# Patient Record
Sex: Female | Born: 2000 | Race: White | Hispanic: No | Marital: Single | Smoking: Never smoker
Health system: Southern US, Community
[De-identification: ages and names within clinical notes are randomized; demographics above are authoritative.]

## PROBLEM LIST (undated history)

## (undated) DIAGNOSIS — J45909 Unspecified asthma, uncomplicated: Secondary | ICD-10-CM

## (undated) HISTORY — PX: BACK SURGERY: SHX140

## (undated) HISTORY — PX: ADENOIDECTOMY: SHX5191

## (undated) HISTORY — PX: TYMPANOSTOMY TUBE PLACEMENT: SHX32

---
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
## 2020-01-01 NOTE — Progress Notes (Signed)
Virtual Visit via Video Note The purpose of this virtual visit is to provide medical care while limiting exposure to the novel coronavirus.    Consent was obtained for video visit:  Yes.   Answered questions that patient had about telehealth interaction:  Yes.   I discussed the limitations, risks, security and privacy concerns of performing an evaluation and management service by telemedicine. I also discussed with the patient that there may be a patient responsible charge related to this service. The patient expressed understanding and agreed to proceed.  Pt location: Home Physician Location: office Name of referring provider:  Cox, Daryel November, MD I connected with Barbee Shropshire Pickerill at patients initiation/request on 01/02/2020 at  3:30 PM EDT by video enabled telemedicine application and verified that I am speaking with the correct person using two identifiers. Pt MRN:  [id number] Pt DOB:  12-24-2000 Video Participants:  Barbee Shropshire Cara   History of Present Illness:  Kim Turner. Mees is an 19 year old female who follows up for migraine.  UPDATE: MRI of brain was ordered but not performed.  She never received a call to schedule.  Intensity:  severe Duration:  Within an hour with rizatriptan Frequency:  2 to 3 in past 30 days Frequency of abortive medication: 2 to 3 days in past month Rescue protocol:  Rizatriptan 10mg  Current NSAIDS:  naproxen 550mg  (has not been using) Current analgesics:  Tylenol Current triptans:  rizatriptan 10mg  Current ergotamine:  none Current anti-emetic:  none Current muscle relaxants:  none Current anti-anxiolytic:  none Current sleep aide:  none Current Antihypertensive medications:  none Current Antidepressant medications:  none Current Anticonvulsant medications:  none Current anti-CGRP:  none Current Vitamins/Herbal/Supplements:  none Current Antihistamines/Decongestants:  none Other therapy:  none Hormone/birth control:  Turner Other medications:   none  Caffeine:  2 coffees a month; 1 to 2 cans of Pepsi daily Diet:  Does not skip meals.  Vegetarian.  1 to 2 cans of soda daily.  16 oz water daily Exercise:  Not routine Depression:  no; Anxiety:  A little Other pain: no Sleep hygiene: Good.  Feels tired.  HISTORY: Onset:  20 years old. Location:  Varies; middle of head, top of head Quality:  pounding Initial intensity:  5/10.  She denies new headache, thunderclap headache or severe headache that wakes her from sleep. Aura:  no Premonitory Phase:  no Postdrome:  no Associated symptoms:  Photophobia, phonophobia; sometimes nausea.  She denies associated visual disturbance or unilateral numbness or weakness. Initial duration:  Usually wakes up with them.  They last all day Initial drequency:  3 days a week requency of abortive medication: Advil or Tylenol 3 times a month Triggers:  Sometimes loud sounds Relieving factors:  Laying down in dark Activity:  aggravates  Imaging (personally reviewed): 09/01/2016 Carotid US:  Marked heterogeneity of imaged portions of thyroid gland, nonspecific;  Otherwise, normal carotid doppler ultrasound. 09/02/2016 MRI Brain Wo:  No acute intracranial abnormality.  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 09/02/2016 MRA Head:  Normal variant diminutive right A1 and duplicated right MCA M1 but no pathologic intracranial vascular abnormality.    Past NSAIDS:  none Past analgesics:  none Past abortive triptans:  Sumatriptan 50mg  (ineffective) Past abortive ergotamine:  none Past muscle relaxants:  none Past anti-emetic:  none Past antihypertensive medications:  Atenolol 25mg  Past antidepressant medications:  none Past anticonvulsant medications:  none Past anti-CGRP:  none Past vitamins/Herbal/Supplements:  none Past antihistamines/decongestants:  none Other past therapies:  none   Family history of headache:  no  Past Medical History: Past Medical History:  Diagnosis Date  . Asthma      Medications: Outpatient Encounter Medications as of 01/02/2020  Medication Sig  . naproxen sodium (ANAPROX DS) 550 MG tablet Take 1 tablet (550 mg total) by mouth 2 (two) times daily as needed.  Kim Turner 3-0.02 MG tablet   . rizatriptan (MAXALT) 10 MG tablet Take 1 tablet earliest onset of migraine.  May repeat in 2 hours if needed.  Maximum 2 tablets in 24 hours.   No facility-administered encounter medications on file as of 01/02/2020.    Allergies: No Known Allergies  Family History: Family History  Problem Relation Age of Onset  . Healthy Mother   . Healthy Father   . Cancer Maternal Grandfather   . Thyroid disease Neg Hx     Social History: Social History   Socioeconomic History  . Marital status: Single    Spouse name: Not on file  . Number of children: 0  . Years of education: Not on file  . Highest education level: High school graduate  Occupational History  . Not on file  Tobacco Use  . Smoking status: Never Smoker  . Smokeless tobacco: Never Used  Substance and Sexual Activity  . Alcohol use: Never  . Drug use: Never  . Sexual activity: Not on file  Other Topics Concern  . Not on file  Social History Narrative   Kim Turner is a 12th grade student.   She attends [employer]	[place].   She lives with both parents. She has two siblings.   She enjoys art, cooking, and video games,   Right handed   Drinks coffee 1 cup a week, tea sometimes, soda 1 can a day   Social Determinants of Corporate investment banker Strain:   . Difficulty of Paying Living Expenses:   Food Insecurity:   . Worried About Programme researcher, broadcasting/film/video in the Last Year:   . Barista in the Last Year:   Transportation Needs:   . Freight forwarder (Medical):   Kim Kitchen Lack of Transportation (Non-Medical):   Physical Activity:   . Days of Exercise per Week:   . Minutes of Exercise per Session:   Stress:   . Feeling of Stress :   Social Connections:   . Frequency of Communication  with Friends and Family:   . Frequency of Social Gatherings with Friends and Family:   . Attends Religious Services:   . Active Member of Clubs or Organizations:   . Attends Banker Meetings:   Kim Kitchen Marital Status:   Intimate Partner Violence:   . Fear of Current or Ex-Partner:   . Emotionally Abused:   Kim Kitchen Physically Abused:   . Sexually Abused:     Observations/Objective:   Height 5\' 5"  (1.651 m), weight 245 lb (111.1 kg). No acute distress.  Alert and oriented.  Speech fluent and not dysarthric.  Language intact.  Eyes orthophoric on primary gaze.  Face symmetric.  Assessment and Plan:   1.  Migraine without aura, without status migrainosus, not intractable 2.  Pineal gland cyst  1. Will reorder MRI of brain with and without contrast to follow up on pineal gland cyst.  If she doesn't hear from anyone by next Friday to schedule it, she will contact Sunday.  2. For abortive therapy, rizatriptan 10mg  3.  Limit use of pain relievers to no more than 2  days out of week to prevent risk of rebound or medication-overuse headache. 4.  Keep headache diary 5.  Exercise, hydration, caffeine cessation, sleep hygiene, monitor for and avoid triggers 6. Follow up 6 months.   Follow Up Instructions:    -I discussed the assessment and treatment plan with the patient. The patient was provided an opportunity to ask questions and all were answered. The patient agreed with the plan and demonstrated an understanding of the instructions.   The patient was advised to call back or seek an in-person evaluation if the symptoms worsen or if the condition fails to improve as anticipated.   Cira Servant, DO

## 2020-01-02 ENCOUNTER — Telehealth (INDEPENDENT_AMBULATORY_CARE_PROVIDER_SITE_OTHER): Payer: Managed Care, Other (non HMO) | Admitting: Neurology

## 2020-01-02 ENCOUNTER — Other Ambulatory Visit: Payer: Self-pay

## 2020-01-02 ENCOUNTER — Encounter: Payer: Self-pay | Admitting: Neurology

## 2020-01-02 VITALS — Ht 65.0 in | Wt 245.0 lb

## 2020-01-02 DIAGNOSIS — G43009 Migraine without aura, not intractable, without status migrainosus: Secondary | ICD-10-CM | POA: Diagnosis not present

## 2020-01-02 DIAGNOSIS — E348 Other specified endocrine disorders: Secondary | ICD-10-CM | POA: Diagnosis not present

## 2020-02-06 ENCOUNTER — Ambulatory Visit
Admission: RE | Admit: 2020-02-06 | Discharge: 2020-02-06 | Disposition: A | Discharge status: Home / Self Care | Payer: Managed Care, Other (non HMO) | Source: Ambulatory Visit | Attending: Neurology | Admitting: Neurology

## 2020-02-06 DIAGNOSIS — E348 Other specified endocrine disorders: Secondary | ICD-10-CM

## 2020-02-06 MED ORDER — GADOBENATE DIMEGLUMINE 529 MG/ML IV SOLN
20.0000 mL | Freq: Once | INTRAVENOUS | Status: AC | PRN
  Administered 2020-02-06: 20 mL via INTRAVENOUS

## 2020-03-18 ENCOUNTER — Other Ambulatory Visit: Payer: Self-pay | Admitting: Neurology

## 2020-04-01 ENCOUNTER — Ambulatory Visit: Payer: Managed Care, Other (non HMO) | Attending: Internal Medicine

## 2020-04-01 DIAGNOSIS — Z23 Encounter for immunization: Secondary | ICD-10-CM

## 2020-04-01 NOTE — Progress Notes (Signed)
   Covid-19 Vaccination Clinic  Name:  MARCUS GROLL    MRN: [id number] DOB: 09/08/2000  04/01/2020  Ms. Mizzell was observed post Covid-19 immunization for 15 minutes without incident. She was provided with Vaccine Information Sheet and instruction to access the V-Safe system.   Ms. Haley was instructed to call 911 with any severe reactions post vaccine: [name] Difficulty breathing  . Swelling of face and throat  . A fast heartbeat  . A bad rash all over body  . Dizziness and weakness   Immunizations Administered    Name Date Dose VIS Date Route   Pfizer COVID-19 Vaccine 04/01/2020  5:08 PM 0.3 mL 10/29/2018 Intramuscular   Manufacturer: [employer], [employer]   Lot: [id number]   NDC: [address]-5

## 2020-07-02 NOTE — Progress Notes (Signed)
NEUROLOGY FOLLOW UP OFFICE NOTE  Kim Turner [id number]  HISTORY OF PRESENT ILLNESS: Kim Turner is an 19 year old female who follows up for migraine.  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  She is doing well. Intensity:  severe Duration:  Within an hour with rizatriptan Frequency:  1 to 2 in past 30 days Frequency of abortive medication: 2 to 3 days in past month Rescue protocol:  Rizatriptan 10mg  Current NSAIDS:naproxen 550mg  (has not been using) Current analgesics:Tylenol Current triptans:rizatriptan 10mg  Current ergotamine:none Current anti-emetic:none Current muscle relaxants:none Current anti-anxiolytic:none Current sleep aide:none Current Antihypertensive medications:none Current Antidepressant medications:none Current Anticonvulsant medications:none Current anti-CGRP:none Current Vitamins/Herbal/Supplements:none Current Antihistamines/Decongestants:none Other therapy:none Hormone/birth control:[name] Other medications:none  Caffeine:2 coffees a month; 1 to 2 cans of Pepsi daily Diet:Does not skip meals.Vegetarian.1 to 2 cans of soda daily. 16 oz water daily Exercise:Not routine Depression:no; Anxiety:A little Other pain:no Sleep hygiene:Good. Feels tired.  HISTORY: Onset:19 years old. Location:Varies; middle of head, top of head Quality:pounding Initial intensity:5/10.Shedenies new headache, thunderclap headache or severe headache that wakes herfrom sleep. Aura:no Premonitory Phase:no Postdrome:no Associated symptoms:Photophobia, phonophobia; sometimes nausea.Shedenies associated visual disturbance orunilateral numbness or weakness. Initial duration:Usually wakes up with them. They last all day Initial drequency:3 days a week requency of  abortive medication:Advil or Tylenol 3 times a month Triggers:Sometimes loud sounds Relieving factors:Laying down in dark Activity:aggravates  Imaging (personally reviewed): 09/01/2016 Carotid : Marked heterogeneity of imaged portions of thyroid gland, nonspecific; Otherwise, normal carotid doppler ultrasound. 09/02/2016 MRI Brain Wo: No acute intracranial abnormality. 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 09/02/2016 MRA Head: Normal variant diminutive right A1 and duplicated right MCA M1 but no pathologic intracranial vascular abnormality.   Past NSAIDS:none Past analgesics:none Past abortive triptans:Sumatriptan 50mg  (ineffective) Past abortive ergotamine:none Past muscle relaxants:none Past anti-emetic:none Past antihypertensive medications:Atenolol 25mg  Past antidepressant medications:none Past anticonvulsant medications:none Past anti-CGRP:none Past vitamins/Herbal/Supplements:none Past antihistamines/decongestants:none Other past therapies:none   Family history of headache:no  PAST MEDICAL HISTORY: Past Medical History:  Diagnosis Date  . Asthma     MEDICATIONS: Current Outpatient Medications on File Prior to Visit  Medication Sig Dispense Refill  . naproxen sodium (ANAPROX DS) 550 MG tablet Take 1 tablet (550 mg total) by mouth 2 (two) times daily as needed. 16 tablet 3  . [name] 3-0.02 MG tablet     . rizatriptan (MAXALT) 10 MG tablet TAKE 1 TABLET EARLIEST ONSET OF MIGRAINE. MAY REPEAT IN 2 HOURS IF NEEDED.MAXIMUM 2 TAB IN 24 HOURS 10 tablet 3   No current facility-administered medications on file prior to visit.    ALLERGIES: No Known Allergies  FAMILY HISTORY: Family History  Problem Relation Age of Onset  . Healthy Mother   . Healthy Father   . Cancer Maternal Grandfather   . Thyroid disease Neg Hx     SOCIAL HISTORY: Social History   Socioeconomic History  . Marital status: Single    Spouse name: Not on file  .  Number of children: 0  . Years of education: Not on file  . Highest education level: High school graduate  Occupational History  . Not on file  Tobacco Use  . Smoking status: Never Smoker  . Smokeless tobacco: Never Used  Vaping Use  . Vaping Use: Never used  Substance and Sexual Activity  . Alcohol use: Never  . Drug use: Never  . Sexual activity: Not on  file  Other Topics Concern  . Not on file  Social History Narrative   Kim Turner is a 12th grade student.   She attends [employer]	[place].   She lives with both parents. She has two siblings.   She enjoys art, cooking, and video games,   Right handed   Drinks coffee 1 cup a week, tea sometimes, soda 1 can a day   Social Determinants of Health   Financial Resource Strain:   . Difficulty of Paying Living Expenses: Not on file  Food Insecurity:   . Worried About Programme researcher, broadcasting/film/video in the Last Year: Not on file  . Ran Out of Food in the Last Year: Not on file  Transportation Needs:   . Lack of Transportation (Medical): Not on file  . Lack of Transportation (Non-Medical): Not on file  Physical Activity:   . Days of Exercise per Week: Not on file  . Minutes of Exercise per Session: Not on file  Stress:   . Feeling of Stress : Not on file  Social Connections:   . Frequency of Communication with Friends and Family: Not on file  . Frequency of Social Gatherings with Friends and Family: Not on file  . Attends Religious Services: Not on file  . Active Member of Clubs or Organizations: Not on file  . Attends Banker Meetings: Not on file  . Marital Status: Not on file  Intimate Partner Violence:   . Fear of Current or Ex-Partner: Not on file  . Emotionally Abused: Not on file  . Physically Abused: Not on file  . Sexually Abused: Not on file    PHYSICAL EXAM: Blood pressure 137/86, pulse 74, resp. rate 18, height 5\' 5"  (1.651 m), weight 271 lb (122.9 kg), SpO2 99 %. General: No acute distress.  Patient  appears well-groomed.   Head:  Normocephalic/atraumatic Eyes:  Fundi examined but not visualized Neck: supple, no paraspinal tenderness, full range of motion Heart:  Regular rate and rhythm Lungs:  Clear to auscultation bilaterally Back: No paraspinal tenderness Neurological Exam: alert and oriented to person, place, and time. Attention span and concentration intact, recent and remote memory intact, fund of knowledge intact.  Speech fluent and not dysarthric, language intact.  CN II-XII intact. Bulk and tone normal, muscle strength 5/5 throughout.  Sensation to light touch, temperature and vibration intact.  Deep tendon reflexes 2+ throughout, toes downgoing.  Finger to nose and heel to shin testing intact.  Gait normal, Romberg negative.  IMPRESSION: Migraine without aura, without status migrainosus, not intractable Pineal gland cyst, stable  PLAN: 1.  Rizatriptan 10mg  as needed.  Limit use of pain relievers to no more than 2 days out of week to prevent risk of rebound or medication-overuse headache. 2.  Keep headache diary 3.  We can repeat MRI in 5 years 4.  Follow up one year.  , DO  CC: , MD

## 2020-07-05 ENCOUNTER — Ambulatory Visit: Payer: Managed Care, Other (non HMO) | Admitting: Neurology

## 2020-07-05 ENCOUNTER — Encounter: Payer: Self-pay | Admitting: Neurology

## 2020-07-05 ENCOUNTER — Other Ambulatory Visit: Payer: Self-pay

## 2020-07-05 VITALS — BP 137/86 | HR 74 | Resp 18 | Ht 65.0 in | Wt 271.0 lb

## 2020-07-05 DIAGNOSIS — E348 Other specified endocrine disorders: Secondary | ICD-10-CM

## 2020-07-05 DIAGNOSIS — G43009 Migraine without aura, not intractable, without status migrainosus: Secondary | ICD-10-CM | POA: Diagnosis not present

## 2020-07-05 NOTE — Patient Instructions (Signed)
  1. We don't need to repeat MRI for 5 years. 2. Take rizatriptan 10mg  at earliest onset of headache.  May repeat dose once in 2 hours if needed.  Maximum 2 tablets in 24 hours. 3. Limit use of pain relievers to no more than 2 days out of the week.  These medications include acetaminophen, NSAIDs (ibuprofen/Advil/Motrin, naproxen/Aleve, triptans (Imitrex/sumatriptan), Excedrin, and narcotics.  This will help reduce risk of rebound headaches. 4. Be aware of common food triggers:  - Caffeine:  coffee, black tea, cola, Mt. Dew  - Chocolate  - Dairy:  aged cheeses (brie, blue, cheddar, gouda, [place], provolone, Cameron, Swiss, etc), chocolate milk, buttermilk, sour cream, limit eggs and yogurt  - Nuts, peanut butter  - Alcohol  - Cereals/grains:  FRESH breads (fresh bagels, sourdough, doughnuts), yeast productions  - Processed/canned/aged/cured meats (pre-packaged deli meats, hotdogs)  - MSG/glutamate:  soy sauce, flavor enhancer, pickled/preserved/marinated foods  - Sweeteners:  aspartame (Equal, Nutrasweet).  Sugar and Splenda are okay  - Vegetables:  legumes (lima beans, lentils, snow peas, fava beans, pinto peans, peas, garbanzo beans), sauerkraut, onions, olives, pickles  - Fruit:  avocados, bananas, citrus fruit (orange, lemon, grapefruit), mango  - Other:  Frozen meals, macaroni and cheese 5. Routine exercise 6. Stay adequately hydrated (aim for 64 oz water daily) 7. Keep headache diary 8. Maintain proper stress management 9. Maintain proper sleep hygiene 10. Do not skip meals 11. Consider supplements:  magnesium citrate 400mg  daily, riboflavin 400mg  daily, coenzyme Q10 100mg  three times daily.

## 2020-07-18 ENCOUNTER — Other Ambulatory Visit: Payer: Self-pay | Admitting: Neurology

## 2020-10-19 ENCOUNTER — Ambulatory Visit: Payer: Managed Care, Other (non HMO) | Admitting: Family

## 2020-12-30 ENCOUNTER — Other Ambulatory Visit: Payer: Self-pay

## 2020-12-31 ENCOUNTER — Ambulatory Visit: Payer: Managed Care, Other (non HMO) | Admitting: Internal Medicine

## 2020-12-31 ENCOUNTER — Encounter: Payer: Self-pay | Admitting: Internal Medicine

## 2020-12-31 ENCOUNTER — Other Ambulatory Visit: Payer: Self-pay

## 2020-12-31 ENCOUNTER — Other Ambulatory Visit (INDEPENDENT_AMBULATORY_CARE_PROVIDER_SITE_OTHER): Payer: Managed Care, Other (non HMO)

## 2020-12-31 VITALS — BP 122/88 | HR 100 | Temp 98.8°F | Resp 18 | Ht 65.0 in | Wt 276.0 lb

## 2020-12-31 DIAGNOSIS — N912 Amenorrhea, unspecified: Secondary | ICD-10-CM | POA: Diagnosis not present

## 2020-12-31 DIAGNOSIS — E065 Other chronic thyroiditis: Secondary | ICD-10-CM | POA: Insufficient documentation

## 2020-12-31 DIAGNOSIS — G43009 Migraine without aura, not intractable, without status migrainosus: Secondary | ICD-10-CM | POA: Diagnosis not present

## 2020-12-31 DIAGNOSIS — J4520 Mild intermittent asthma, uncomplicated: Secondary | ICD-10-CM

## 2020-12-31 DIAGNOSIS — J45909 Unspecified asthma, uncomplicated: Secondary | ICD-10-CM | POA: Insufficient documentation

## 2020-12-31 LAB — LIPID PANEL
Cholesterol: 173 mg/dL (ref 0–200)
HDL: 38.9 mg/dL — ABNORMAL LOW (ref >39.00)
LDL Cholesterol: 103 mg/dL — ABNORMAL HIGH (ref 0–99)
NonHDL: 133.73
Total CHOL/HDL Ratio: 4
Triglycerides: 154 mg/dL — ABNORMAL HIGH (ref 0.0–149.0)
VLDL: 30.8 mg/dL (ref 0.0–40.0)

## 2020-12-31 LAB — COMPREHENSIVE METABOLIC PANEL
ALT: 28 U/L (ref 0–35)
AST: 21 U/L (ref 0–37)
Albumin: 4.3 g/dL (ref 3.5–5.2)
Alkaline Phosphatase: 101 U/L (ref 39–117)
BUN: 9 mg/dL (ref 6–23)
CO2: 29 mEq/L (ref 19–32)
Calcium: 9.5 mg/dL (ref 8.4–10.5)
Chloride: 101 mEq/L (ref 96–112)
Creatinine, Ser: 0.81 mg/dL (ref 0.40–1.20)
GFR: 104.62 mL/min (ref >60.00)
Glucose, Bld: 164 mg/dL — ABNORMAL HIGH (ref 70–99)
Potassium: 4.3 mEq/L (ref 3.5–5.1)
Sodium: 138 mEq/L (ref 135–145)
Total Bilirubin: 0.3 mg/dL (ref 0.2–1.2)
Total Protein: 7.8 g/dL (ref 6.0–8.3)

## 2020-12-31 LAB — CBC
HCT: 37.4 % (ref 36.0–46.0)
Hemoglobin: 11.9 g/dL — ABNORMAL LOW (ref 12.0–15.0)
MCHC: 31.9 g/dL (ref 30.0–36.0)
MCV: 71.2 fl — ABNORMAL LOW (ref 78.0–100.0)
Platelets: 471 10*3/uL — ABNORMAL HIGH (ref 150.0–400.0)
RBC: 5.25 Mil/uL — ABNORMAL HIGH (ref 3.87–5.11)
RDW: 17.8 % — ABNORMAL HIGH (ref 11.5–14.6)
WBC: 9.5 10*3/uL (ref 4.5–10.5)

## 2020-12-31 LAB — HEMOGLOBIN A1C: Hgb A1c MFr Bld: 6.8 % — ABNORMAL HIGH (ref 4.6–6.5)

## 2020-12-31 MED ORDER — ALBUTEROL SULFATE HFA 108 (90 BASE) MCG/ACT IN AERS: 1.0000 | INHALATION_SPRAY | RESPIRATORY_TRACT | 3 refills | Status: AC | PRN

## 2020-12-31 NOTE — Assessment & Plan Note (Signed)
Checking HgA1c, lipid, CBC, CMP and adjust as needed. Offered nutrition referral which she did not want for now. Counseled about diet and exercise. Will follow up 6 months.

## 2020-12-31 NOTE — Assessment & Plan Note (Signed)
US thyroid 2020 without nodules, checking TSH and free T4 and adjust as needed. Having weight gain. No heat or cold intolerance.

## 2020-12-31 NOTE — Assessment & Plan Note (Signed)
Rx albuterol inhaler for prn usage.

## 2020-12-31 NOTE — Patient Instructions (Signed)
We are checking the labs today and will let you know about the results.   Health Maintenance, Female Adopting a healthy lifestyle and getting preventive care are important in promoting health and wellness. Ask your health care provider about:  The right schedule for you to have regular tests and exams.  Things you can do on your own to prevent diseases and keep yourself healthy. What should I know about diet, weight, and exercise? Eat a healthy diet  Eat a diet that includes plenty of vegetables, fruits, low-fat dairy products, and lean protein.  Do not eat a lot of foods that are high in solid fats, added sugars, or sodium.   Maintain a healthy weight Body mass index (BMI) is used to identify weight problems. It estimates body fat based on height and weight. Your health care provider can help determine your BMI and help you achieve or maintain a healthy weight. Get regular exercise Get regular exercise. This is one of the most important things you can do for your health. Most adults should:  Exercise for at least 150 minutes each week. The exercise should increase your heart rate and make you sweat (moderate-intensity exercise).  Do strengthening exercises at least twice a week. This is in addition to the moderate-intensity exercise.  Spend less time sitting. Even light physical activity can be beneficial. Watch cholesterol and blood lipids Have your blood tested for lipids and cholesterol at 20 years of age, then have this test every 5 years. Have your cholesterol levels checked more often if:  Your lipid or cholesterol levels are high.  You are older than 20 years of age.  You are at high risk for heart disease. What should I know about cancer screening? Depending on your health history and family history, you may need to have cancer screening at various ages. This may include screening for:  Breast cancer.  Cervical cancer.  Colorectal cancer.  Skin cancer.  Lung  cancer. What should I know about heart disease, diabetes, and high blood pressure? Blood pressure and heart disease  High blood pressure causes heart disease and increases the risk of stroke. This is more likely to develop in people who have high blood pressure readings, are of African descent, or are overweight.  Have your blood pressure checked: ? Every 3-5 years if you are 21-48 years of age. ? Every year if you are 48 years old or older. Diabetes Have regular diabetes screenings. This checks your fasting blood sugar level. Have the screening done:  Once every three years after age 42 if you are at a normal weight and have a low risk for diabetes.  More often and at a younger age if you are overweight or have a high risk for diabetes. What should I know about preventing infection? Hepatitis B If you have a higher risk for hepatitis B, you should be screened for this virus. Talk with your health care provider to find out if you are at risk for hepatitis B infection. Hepatitis C Testing is recommended for:  Everyone born from [birth year] through 1965.  Anyone with known risk factors for hepatitis C. Sexually transmitted infections (STIs)  Get screened for STIs, including gonorrhea and chlamydia, if: ? You are sexually active and are younger than 20 years of age. ? You are older than 20 years of age and your health care provider tells you that you are at risk for this type of infection. ? Your sexual activity has changed since you were last  screened, and you are at increased risk for chlamydia or gonorrhea. Ask your health care provider if you are at risk.  Ask your health care provider about whether you are at high risk for HIV. Your health care provider may recommend a prescription medicine to help prevent HIV infection. If you choose to take medicine to prevent HIV, you should first get tested for HIV. You should then be tested every 3 months for as long as you are taking the  medicine. Pregnancy  If you are about to stop having your period (premenopausal) and you may become pregnant, seek counseling before you get pregnant.  Take 400 to 800 micrograms (mcg) of folic acid every day if you become pregnant.  Ask for birth control (contraception) if you want to prevent pregnancy. Osteoporosis and menopause Osteoporosis is a disease in which the bones lose minerals and strength with aging. This can result in bone fractures. If you are 65 years old or older, or if you are at risk for osteoporosis and fractures, ask your health care provider if you should:  Be screened for bone loss.  Take a calcium or vitamin D supplement to lower your risk of fractures.  Be given hormone replacement therapy (HRT) to treat symptoms of menopause. Follow these instructions at home: Lifestyle  Do not use any products that contain nicotine or tobacco, such as cigarettes, e-cigarettes, and chewing tobacco. If you need help quitting, ask your health care provider.  Do not use street drugs.  Do not share needles.  Ask your health care provider for help if you need support or information about quitting drugs. Alcohol use  Do not drink alcohol if: ? Your health care provider tells you not to drink. ? You are pregnant, may be pregnant, or are planning to become pregnant.  If you drink alcohol: ? Limit how much you use to 0-1 drink a day. ? Limit intake if you are breastfeeding.  Be aware of how much alcohol is in your drink. In the [place] one drink equals one 12 oz bottle of beer (355 mL), one 5 oz glass of wine (148 mL), or one 1 oz glass of hard liquor (44 mL). General instructions  Schedule regular health, dental, and eye exams.  Stay current with your vaccines.  Tell your health care provider if: ? You often feel depressed. ? You have ever been abused or do not feel safe at home. Summary  Adopting a healthy lifestyle and getting preventive care are important in  promoting health and wellness.  Follow your health care provider's instructions about healthy diet, exercising, and getting tested or screened for diseases.  Follow your health care provider's instructions on monitoring your cholesterol and blood pressure. This information is not intended to replace advice given to you by your health care provider. Make sure you discuss any questions you have with your health care provider. Document Revised: 08/14/2018 Document Reviewed: 08/14/2018 Elsevier Patient Education  2021 Elsevier Inc.  

## 2020-12-31 NOTE — Progress Notes (Signed)
   Subjective:   Patient ID: Kim Turner, female    DOB: 12-23-2000, 20 y.o.   MRN: [id number]  HPI The patient is a new 20 YO female coming in for concerns about weight (struggling to lose weight, having problems with back pain, prior back surgery and feels as though the back pain was worse when she was lighter to 220s due to large breasts, wants to work on weight loss herself) and asthma (well controlled, mostly uses albuterol only if allergies or cold, has inhaler not sure how old it is, denies flare currently, has not used albuterol in some time, denies night time awakenings), and no periods (stopped birth control due to not seeing provider about 6 months or more ago, no period in 2 months, not sexually active so not concerned about pregnancy, has been on birth control mostly and thinks she may have had irregular periods in the past early in teens when not on birth control, denies painful cramps).   PMH, [facility], social history reviewed and updated  Review of Systems  Constitutional: Negative.   HENT: Negative.   Eyes: Negative.   Respiratory: Negative for cough, chest tightness and shortness of breath.   Cardiovascular: Negative for chest pain, palpitations and leg swelling.  Gastrointestinal: Negative for abdominal distention, abdominal pain, constipation, diarrhea, nausea and vomiting.  Genitourinary: Positive for menstrual problem.  Musculoskeletal: Negative.   Skin: Negative.   Neurological: Negative.   Psychiatric/Behavioral: Negative.     Objective:  Physical Exam Constitutional:      Appearance: She is well-developed. She is obese.  HENT:     Head: Normocephalic and atraumatic.  Cardiovascular:     Rate and Rhythm: Normal rate and regular rhythm.  Pulmonary:     Effort: Pulmonary effort is normal. No respiratory distress.     Breath sounds: Normal breath sounds. No wheezing or rales.  Abdominal:     General: Bowel sounds are normal. There is no distension.      Palpations: Abdomen is soft.     Tenderness: There is no abdominal tenderness. There is no rebound.  Musculoskeletal:     Cervical back: Normal range of motion.  Skin:    General: Skin is warm and dry.  Neurological:     Mental Status: She is alert and oriented to person, place, and time.     Coordination: Coordination normal.     Vitals:   12/31/20 1557  BP: 122/88  Pulse: 100  Resp: 18  Temp: 98.8 F (37.1 C)  TempSrc: Oral  SpO2: 99%  Weight: 276 lb (125.2 kg)  Height: 5\' 5"  (1.651 m)    This visit occurred during the SARS-CoV-2 public health emergency.  Safety protocols were in place, including screening questions prior to the visit, additional usage of staff PPE, and extensive cleaning of exam room while observing appropriate contact time as indicated for disinfecting solutions.   Assessment & Plan:

## 2020-12-31 NOTE — Assessment & Plan Note (Signed)
Unclear etiology. Possibly PCOS has not been evaluated for this. Checking Hcg today and prolactin (given pineal gland cyst). Checking thyroid and CBC.

## 2020-12-31 NOTE — Assessment & Plan Note (Signed)
Doing better now and using rizatriptan once a month or less.

## 2021-01-01 LAB — PROLACTIN: Prolactin: 6.3 ng/mL

## 2021-01-03 LAB — VITAMIN B12: Vitamin B-12: 310 pg/mL (ref 211–911)

## 2021-01-03 LAB — HCG, QUANTITATIVE, PREGNANCY: Quantitative HCG: <0.6 m[IU]/mL

## 2021-01-03 LAB — T4, FREE: Free T4: 0.58 ng/dL — ABNORMAL LOW (ref 0.60–1.60)

## 2021-01-03 LAB — VITAMIN D 25 HYDROXY (VIT D DEFICIENCY, FRACTURES): VITD: 14.25 ng/mL — ABNORMAL LOW (ref 30.00–100.00)

## 2021-01-03 LAB — TSH: TSH: 6.07 u[IU]/mL — ABNORMAL HIGH (ref 0.35–5.50)

## 2021-01-06 ENCOUNTER — Other Ambulatory Visit: Payer: Self-pay | Admitting: Internal Medicine

## 2021-01-06 ENCOUNTER — Encounter (INDEPENDENT_AMBULATORY_CARE_PROVIDER_SITE_OTHER): Payer: Self-pay

## 2021-01-06 MED ORDER — LEVOTHYROXINE SODIUM 50 MCG PO TABS: 50.0000 ug | ORAL_TABLET | Freq: Every day | ORAL | 1 refills | Status: DC

## 2021-01-06 MED ORDER — VITAMIN D (ERGOCALCIFEROL) 1.25 MG (50000 UNIT) PO CAPS: 50000.0000 [IU] | ORAL_CAPSULE | ORAL | 0 refills | Status: DC

## 2021-04-01 ENCOUNTER — Ambulatory Visit: Payer: Managed Care, Other (non HMO) | Admitting: Internal Medicine

## 2021-04-12 ENCOUNTER — Ambulatory Visit: Payer: Managed Care, Other (non HMO) | Admitting: Internal Medicine

## 2021-04-12 ENCOUNTER — Encounter: Payer: Self-pay | Admitting: Internal Medicine

## 2021-04-12 ENCOUNTER — Other Ambulatory Visit: Payer: Self-pay

## 2021-04-12 VITALS — BP 124/84 | HR 101 | Temp 99.3°F | Resp 18 | Ht 65.0 in | Wt 272.2 lb

## 2021-04-12 DIAGNOSIS — E065 Other chronic thyroiditis: Secondary | ICD-10-CM | POA: Diagnosis not present

## 2021-04-12 DIAGNOSIS — E119 Type 2 diabetes mellitus without complications: Secondary | ICD-10-CM | POA: Insufficient documentation

## 2021-04-12 DIAGNOSIS — R7301 Impaired fasting glucose: Secondary | ICD-10-CM | POA: Diagnosis not present

## 2021-04-12 LAB — POCT GLYCOSYLATED HEMOGLOBIN (HGB A1C): Hemoglobin A1C: 6.3 % — AB (ref 4.0–5.6)

## 2021-04-12 NOTE — Progress Notes (Signed)
   Subjective:   Patient ID: Kim Turner, female    DOB: 01/31/2001, 20 y.o.   MRN: [id number]  HPI The patient is a 20 YO female coming in for follow up medical conditions.   Review of Systems  Constitutional: Negative.   HENT: Negative.    Eyes: Negative.   Respiratory:  Negative for cough, chest tightness and shortness of breath.   Cardiovascular:  Negative for chest pain, palpitations and leg swelling.  Gastrointestinal:  Negative for abdominal distention, abdominal pain, constipation, diarrhea, nausea and vomiting.  Musculoskeletal: Negative.   Skin: Negative.   Neurological: Negative.   Psychiatric/Behavioral: Negative.     Objective:  Physical Exam Constitutional:      Appearance: She is well-developed. She is obese.  HENT:     Head: Normocephalic and atraumatic.  Cardiovascular:     Rate and Rhythm: Normal rate and regular rhythm.  Pulmonary:     Effort: Pulmonary effort is normal. No respiratory distress.     Breath sounds: Normal breath sounds. No wheezing or rales.  Abdominal:     General: Bowel sounds are normal. There is no distension.     Palpations: Abdomen is soft.     Tenderness: There is no abdominal tenderness. There is no rebound.  Musculoskeletal:     Cervical back: Normal range of motion.  Skin:    General: Skin is warm and dry.  Neurological:     Mental Status: She is alert and oriented to person, place, and time.     Coordination: Coordination normal.    Vitals:   04/12/21 1551  BP: 124/84  Pulse: (!) 101  Resp: 18  Temp: 99.3 F (37.4 C)  TempSrc: Oral  SpO2: 99%  Weight: 272 lb 3.2 oz (123.5 kg)  Height: 5\' 5"  (1.651 m)    This visit occurred during the SARS-CoV-2 public health emergency.  Safety protocols were in place, including screening questions prior to the visit, additional usage of staff PPE, and extensive cleaning of exam room while observing appropriate contact time as indicated for disinfecting solutions.   Assessment &  Plan:

## 2021-04-12 NOTE — Patient Instructions (Signed)
The HgA1c is 6.3 so in the pre-diabetes range.

## 2021-04-13 LAB — T4, FREE: Free T4: 0.83 ng/dL (ref 0.60–1.60)

## 2021-04-13 LAB — TSH: TSH: 3.04 u[IU]/mL (ref 0.35–5.50)

## 2021-04-15 NOTE — Assessment & Plan Note (Signed)
Checking TSH and free T4 as we changed medication dose last visit to synthroid 50 mcg daily. Adjust as needed.

## 2021-04-15 NOTE — Assessment & Plan Note (Signed)
POC HgA1c done today which is is pre-diabetes range. We talked about diet to help avoid getting diabetes.

## 2021-06-09 ENCOUNTER — Other Ambulatory Visit: Payer: Self-pay | Admitting: Internal Medicine

## 2021-07-04 NOTE — Progress Notes (Signed)
NEUROLOGY FOLLOW UP OFFICE NOTE  QUINYA ZAUNBRECHER [id number]  Assessment/Plan:   Migraine without aura, without status migrainosus, not intractable Pineal gland cyst, stable  Migraine prevention:  deferred as infrequent Migraine rescue:  rizatriptan 10mg  Limit use of pain relievers to no more than 2 days out of week to prevent risk of rebound or medication-overuse headache. Keep headache diary Repeat MRI of brain in 4 years Follow up 1 year.   Subjective:  Barbee Shropshire. Hoopes is an 20 year old female with chronic thyroiditis who follows up for migraine.   UPDATE: She is doing well. Intensity:  usually moderate, rarely severe Duration:  Within an hour with rizatriptan Frequency:  1 to 2 a month Frequency of abortive medication: 1 to 2 days a month Rescue protocol:  Rizatriptan 10mg  Current NSAIDS:  none Current analgesics:  none Current triptans:  rizatriptan 10mg  Current ergotamine:  none Current anti-emetic:  none Current muscle relaxants:  none Current anti-anxiolytic:  none Current sleep aide:  none Current Antihypertensive medications:  none Current Antidepressant medications:  none Current Anticonvulsant medications:  none Current anti-CGRP:  none Current Vitamins/Herbal/Supplements:  none Current Antihistamines/Decongestants:  none Other therapy:  none Hormone/birth control:  [name] Other medications:  none   Caffeine:  2 coffees a month; 1 to 2 cans of Pepsi a week Diet:  Mostly water.  Does not skip meals.  Vegetarian.  1 to 2 cans of soda a week Exercise:  She was swimming daily during the summer.  Trying to find something else to do.   Depression:  no; Anxiety:  A little Other pain: no Sleep hygiene: Good.  Feels tired.   HISTORY:  Onset:  20 years old. Location:  Varies; middle of head, top of head Quality:  pounding Initial intensity:  5/10.  She denies new headache, thunderclap headache or severe headache that wakes her from sleep. Aura:   no Premonitory Phase:  no Postdrome:  no Associated symptoms:  Photophobia, phonophobia; sometimes nausea.  She denies associated visual disturbance or unilateral numbness or weakness. Initial duration:  Usually wakes up with them.  They last all day Initial drequency:  3 days a week requency of abortive medication: Advil or Tylenol 3 times a month Triggers:  Sometimes loud sounds Relieving factors:  Laying down in dark Activity:  aggravates   Imaging (personally reviewed): 09/01/2016 Carotid US:  Marked heterogeneity of imaged portions of thyroid gland, nonspecific;  Otherwise, normal carotid doppler ultrasound. 09/02/2016 MRI Brain Wo:  No acute intracranial abnormality.  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 09/02/2016 MRA Head:  Normal variant diminutive right A1 and duplicated right MCA M1 but no pathologic intracranial vascular abnormality.   02/07/2020 MRI of brain with and without contrast:  stable 11 x 9 mm pineal gland cyst with no suspicious enhancement.   Past NSAIDS:  naproxen 550mg  Past analgesics:  Tylenol Past abortive triptans:  Sumatriptan 50mg  (ineffective) Past abortive ergotamine:  none Past muscle relaxants:  none Past anti-emetic:  none Past antihypertensive medications:  Atenolol 25mg  Past antidepressant medications:  none Past anticonvulsant medications:  none Past anti-CGRP:  none Past vitamins/Herbal/Supplements:  none Past antihistamines/decongestants:  none Other past therapies:  none     Family history of headache:  no  PAST MEDICAL HISTORY: Past Medical History:  Diagnosis Date   Asthma     MEDICATIONS: Current Outpatient Medications on File Prior to Visit  Medication Sig Dispense Refill   albuterol (VENTOLIN HFA) 108 (90 Base) MCG/ACT inhaler Inhale 1-2 puffs into  the lungs every 4 (four) hours as needed for wheezing or shortness of breath. 6 each 3   levothyroxine (SYNTHROID) 50 MCG tablet Take 1 tablet (50 mcg total) by mouth daily. 90 tablet 1    rizatriptan (MAXALT) 10 MG tablet TAKE 1 TABLET EARLIEST ONSET OF MIGRAINE. MAY REPEAT IN 2 HOURS IF NEEDED.MAXIMUM 2 TAB IN 24 HOURS 10 tablet 11   Vitamin D, Ergocalciferol, (DRISDOL) 1.25 MG (50000 UNIT) CAPS capsule Take 1 capsule (50,000 Units total) by mouth every 7 (seven) days. 12 capsule 0   No current facility-administered medications on file prior to visit.    ALLERGIES: No Known Allergies  FAMILY HISTORY: Family History  Problem Relation Age of Onset   Healthy Mother    Healthy Father    Cancer Maternal Grandfather    Thyroid disease Neg Hx       Objective:  Blood pressure 123/89, pulse (!) 107, height 5\' 5"  (1.651 m), weight 274 lb (124.3 kg). General: No acute distress.  Patient appears well-groomed.   Head:  Normocephalic/atraumatic Eyes:  Fundi examined but not visualized Neck: supple, no paraspinal tenderness, full range of motion Heart:  Regular rate and rhythm Lungs:  Clear to auscultation bilaterally Back: No paraspinal tenderness Neurological Exam: alert and oriented to person, place, and time.  Speech fluent and not dysarthric, language intact.  CN II-XII intact. Bulk and tone normal, muscle strength 5/5 throughout.  Sensation to light touch intact.  Deep tendon reflexes 2+ throughout, toes downgoing.  Finger to nose testing intact.  Gait normal, Romberg negative.   , DO  CC: Shon Millet, MD

## 2021-07-05 ENCOUNTER — Other Ambulatory Visit: Payer: Self-pay

## 2021-07-05 ENCOUNTER — Encounter: Payer: Self-pay | Admitting: Neurology

## 2021-07-05 ENCOUNTER — Ambulatory Visit: Payer: Managed Care, Other (non HMO) | Admitting: Neurology

## 2021-07-05 VITALS — BP 123/89 | HR 107 | Ht 65.0 in | Wt 274.0 lb

## 2021-07-05 DIAGNOSIS — G43009 Migraine without aura, not intractable, without status migrainosus: Secondary | ICD-10-CM

## 2021-07-05 MED ORDER — RIZATRIPTAN BENZOATE 10 MG PO TABS: ORAL_TABLET | ORAL | 11 refills | Status: DC

## 2021-07-05 NOTE — Patient Instructions (Signed)
Refilled rizatriptan 10mg .

## 2021-07-08 ENCOUNTER — Ambulatory Visit: Payer: Managed Care, Other (non HMO) | Admitting: Internal Medicine

## 2021-08-15 ENCOUNTER — Other Ambulatory Visit: Payer: Self-pay | Admitting: Internal Medicine

## 2022-04-04 IMAGING — MR MR HEAD WO/W CM
13 series · 48 of 48 positions shown · IV contrast (multihance)
Comparison: Brain MRI 09/02/2016

CLINICAL DATA: Pineal gland cyst.

EXAM:
MRI HEAD WITHOUT AND WITH CONTRAST
TECHNIQUE: Multiplanar, multiecho pulse sequences of the brain and surrounding
structures were obtained without and with intravenous contrast.
CONTRAST:  20mL MULTIHANCE GADOBENATE DIMEGLUMINE 529 MG/ML IV SOLN

[Series 5: T1 · sagittal · 4.0mm · 0.75mm/px · 1 of 31 slices shown (1 of 3)]
[im 1/31]
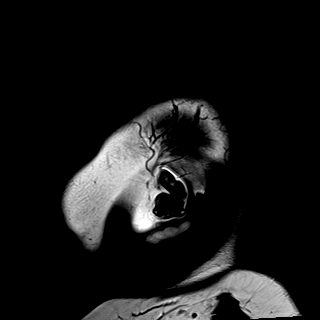

[Series 6: DWI · axial · 3.0mm · 1.44mm/px · z∈[-33,+99]mm · 5 of 84 slices shown (1 of 4)]
[im 1/84]
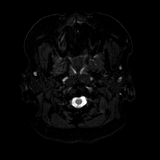
[im 21/84]
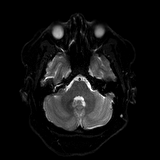
[im 42/84]
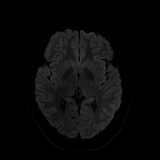
[im 63/84]
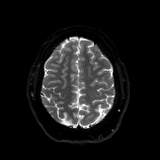
[im 84/84]
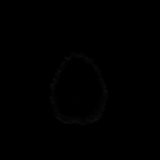

[Series 7: DWI · axial · 3.0mm · 1.44mm/px · z∈[-33,+99]mm · 2 of 42 slices shown (2 of 4)]
[im 1/42]
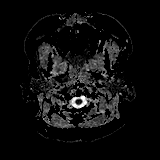
[im 42/42]
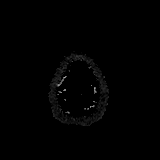

[Series 8: DWI · coronal · 5.0mm · 1.44mm/px · 4 of 60 slices shown (3 of 4)]
[im 1/60]
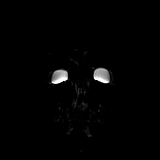
[im 20/60]
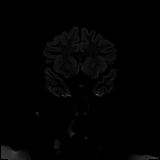
[im 40/60]
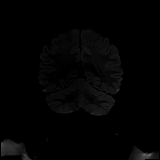
[im 60/60]
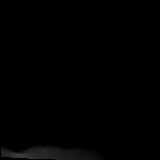

[Series 9: DWI · coronal · 5.0mm · 1.44mm/px · 2 of 30 slices shown (4 of 4)]
[im 1/30]
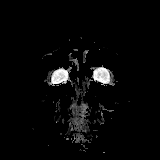
[im 30/30]
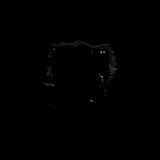

[Series 10: T2 · axial · 4.0mm · 0.36mm/px · z∈[-35,+98]mm · 2 of 27 slices shown]
[im 1/27]
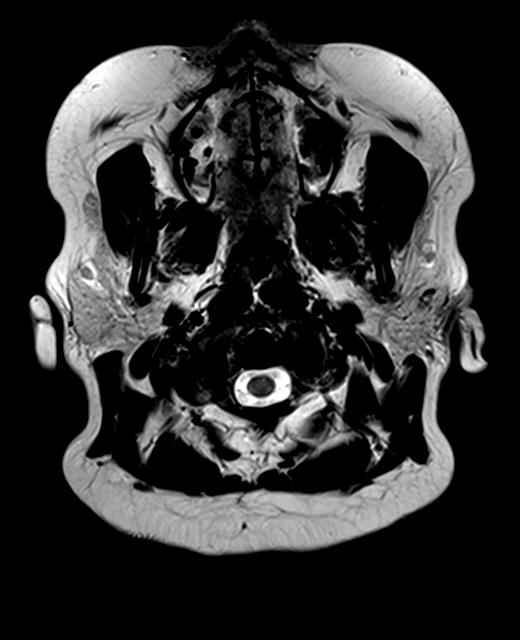
[im 27/27]
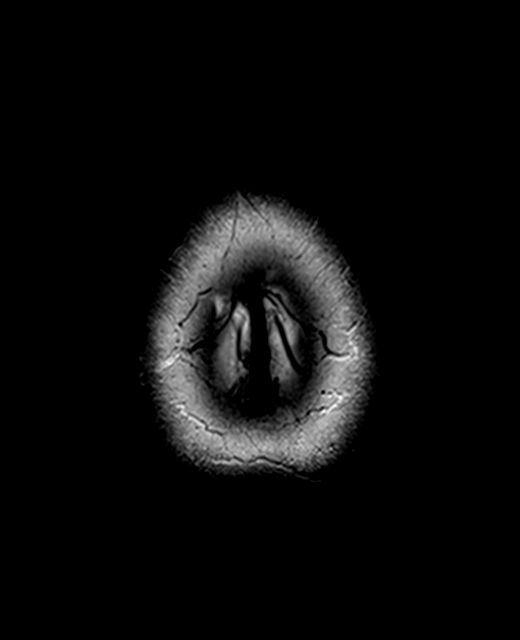

[Series 11: FLAIR · axial · 3.0mm · 0.72mm/px · z∈[-44,+103]mm · 2 of 26 slices shown]
[im 1/26]
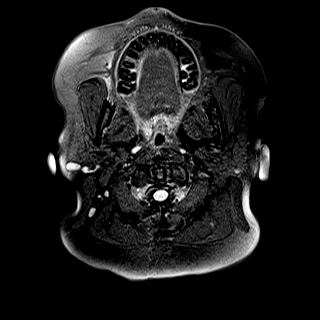
[im 26/26]
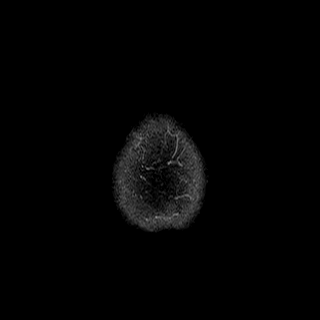

[Series 13: swi_images · axial · 1.5mm · 0.90mm/px · z∈[-38,+102]mm · 6 of 96 slices shown]
[im 1/96]
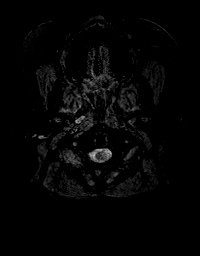
[im 20/96]
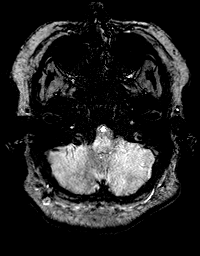
[im 39/96]
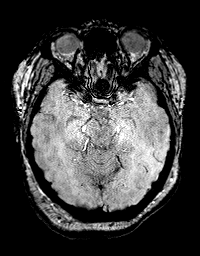
[im 58/96]
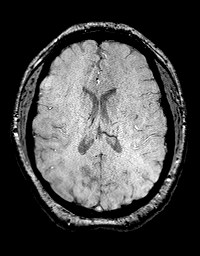
[im 77/96]
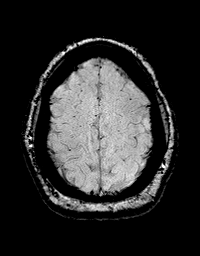
[im 96/96]
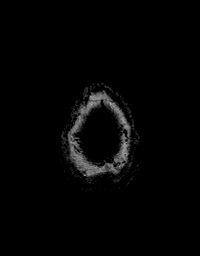

[Series 14: T1 · axial · 1.0mm · 0.94mm/px · z∈[-45,+111]mm · 9 of 160 slices shown (2 of 3)]
[im 1/160]
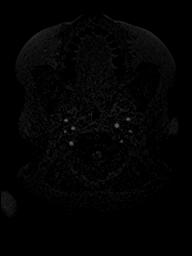
[im 20/160]
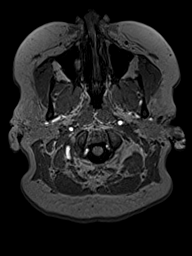
[im 40/160]
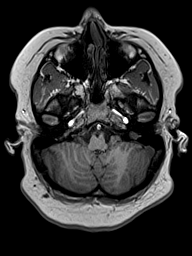
[im 60/160]
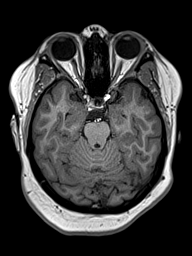
[im 80/160]
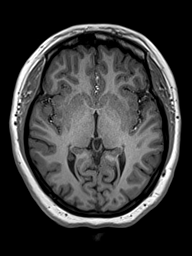
[im 100/160]
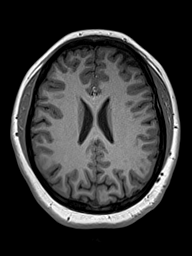
[im 120/160]
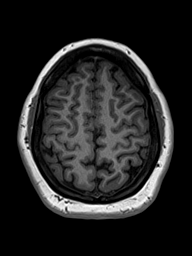
[im 140/160]
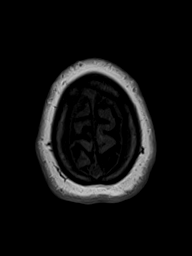
[im 160/160]
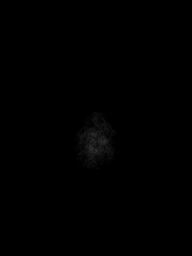

[Series 15: T2 post-contrast · coronal · 4.0mm · 0.36mm/px · 2 of 34 slices shown]
[im 1/34]
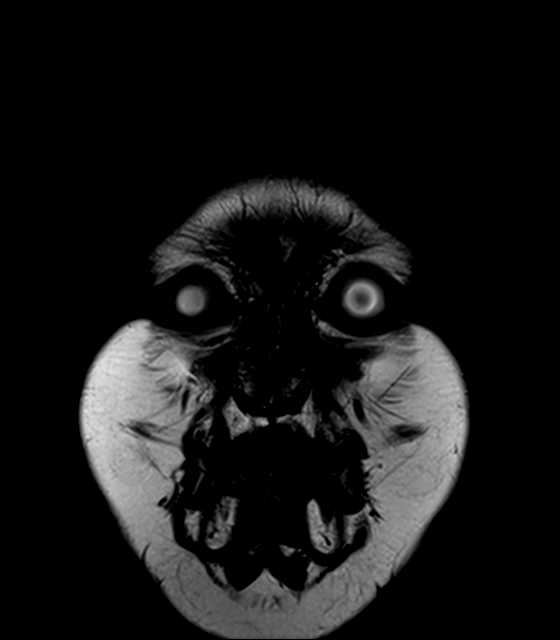
[im 34/34]
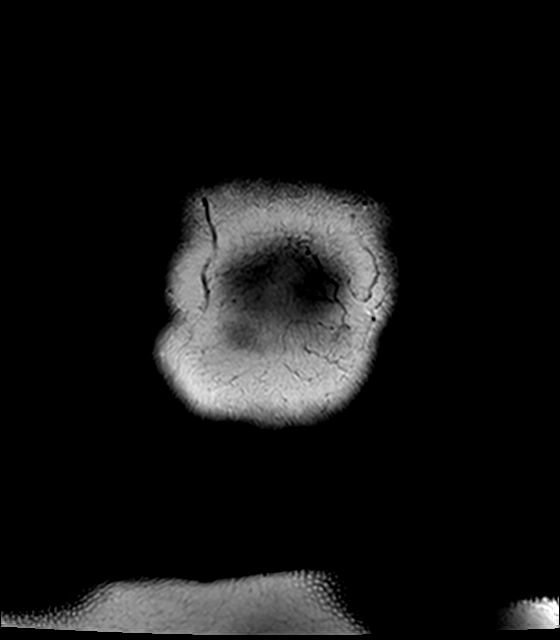

[Series 16: T1 · axial · 1.0mm · 0.94mm/px · z∈[-45,+111]mm · 9 of 160 slices shown (3 of 3)]
[im 1/160]
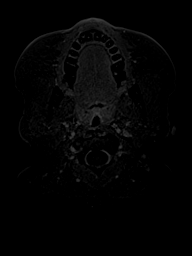
[im 20/160]
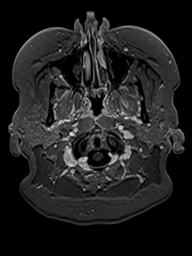
[im 40/160]
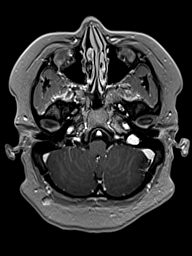
[im 60/160]
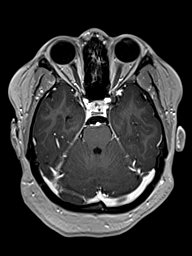
[im 80/160]
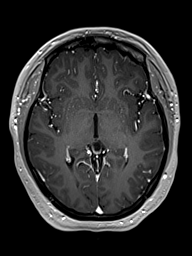
[im 100/160]
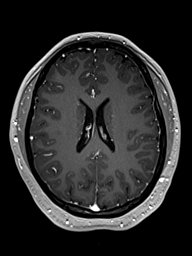
[im 120/160]
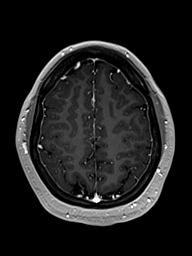
[im 140/160]
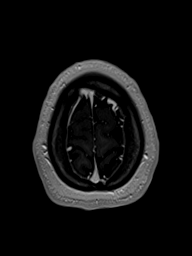
[im 160/160]
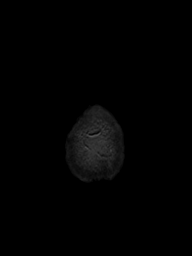

[Series 17: T1 post-contrast · coronal · 4.0mm · 0.72mm/px · 2 of 34 slices shown (1 of 2)]
[im 1/34]
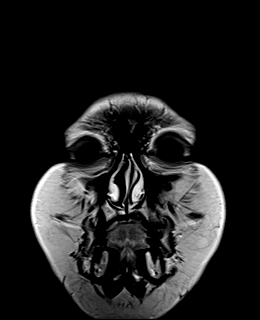
[im 34/34]
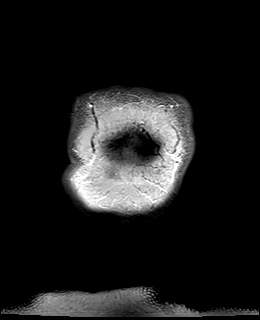

[Series 18: T1 post-contrast · sagittal · 4.0mm · 0.75mm/px · 2 of 31 slices shown (2 of 2)]
[im 1/31]
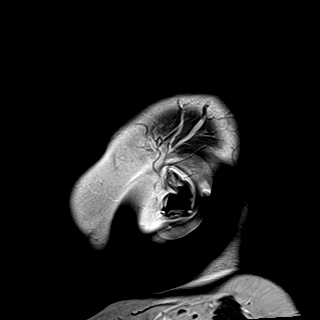
[im 31/31]
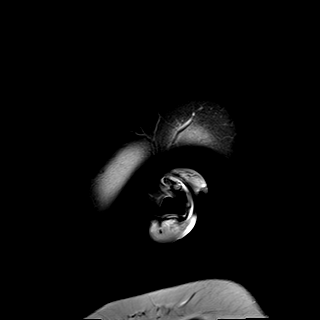

[48 of 48 positions shown; findings below may reference images not displayed]

FINDINGS: Brain:

11 x 9 mm pineal gland cyst, stable in size as compared to prior MRI
09/02/2016. The cyst demonstrates no suspicious enhancement.

Elsewhere, no focal parenchymal signal abnormality or abnormal
intracranial enhancement is demonstrated.

There is no acute infarct.

No chronic intracranial blood products.

No extra-axial fluid collection.

No midline shift.

Vascular: Expected proximal arterial flow voids.

Skull and upper cervical spine: No focal marrow lesion.

Sinuses/Orbits: Visualized orbits show no acute finding. Mild
ethmoid sinus mucosal thickening. No significant mastoid effusion
IMPRESSION: An 11 x 9 mm pineal gland cyst has remained stable in size when
compared to prior MRI 09/02/2016. This cyst demonstrates no
suspicious enhancement.

Otherwise unremarkable MRI appearance of the brain.

Mild ethmoid sinus mucosal thickening.

## 2022-07-04 NOTE — Progress Notes (Unsigned)
NEUROLOGY FOLLOW UP OFFICE NOTE  JEMIA ZOTTER [id number]  Assessment/Plan:   Migraine without aura without status migrainosus, not intractable Pineal gland cyst, stable  Migraine prevention:  deferred as infrequent Migraine rescue:  rizatriptan 10mg  Limit use of pain relievers to no more than 2 days out of week to prevent risk of rebound or medication-overuse headache. Keep headache diary Repeat MRI of brain in 3 years Follow up 1 year.   Subjective:  Kim Turner. Luhn is an 21 year old female with chronic thyroiditis who follows up for migraine.   UPDATE: She is doing well. Intensity:  usually moderate, rarely severe Duration:  Within an hour with rizatriptan Frequency:  1 to 2 a month Frequency of abortive medication: 1 to 2 days a month Rescue protocol:  Rizatriptan 10mg  Current NSAIDS:  none Current analgesics:  none Current triptans:  rizatriptan 10mg  Current ergotamine:  none Current anti-emetic:  none Current muscle relaxants:  none Current anti-anxiolytic:  none Current sleep aide:  none Current Antihypertensive medications:  none Current Antidepressant medications:  none Current Anticonvulsant medications:  none Current anti-CGRP:  none Current Vitamins/Herbal/Supplements:  none Current Antihistamines/Decongestants:  none Other therapy:  none Hormone/birth control:  [name] Other medications:  none   Caffeine:  2 coffees a month; 1 to 2 cans of Pepsi a week Diet:  Mostly water.  Does not skip meals.  Vegetarian.  1 to 2 cans of soda a week Exercise:  She was swimming daily during the summer.  Trying to find something else to do.   Depression:  no; Anxiety:  A little Other pain: no Sleep hygiene: Good.  Feels tired.   HISTORY:  Onset:  21 years old. Location:  Varies; middle of head, top of head Quality:  pounding Initial intensity:  5/10.  She denies new headache, thunderclap headache or severe headache that wakes her from sleep. Aura:   no Premonitory Phase:  no Postdrome:  no Associated symptoms:  Photophobia, phonophobia; sometimes nausea.  She denies associated visual disturbance or unilateral numbness or weakness. Initial duration:  Usually wakes up with them.  They last all day Initial drequency:  3 days a week requency of abortive medication: Advil or Tylenol 3 times a month Triggers:  Sometimes loud sounds Relieving factors:  Laying down in dark Activity:  aggravates   Imaging (personally reviewed): 09/01/2016 Carotid US:  Marked heterogeneity of imaged portions of thyroid gland, nonspecific;  Otherwise, normal carotid doppler ultrasound. 09/02/2016 MRI Brain Wo:  No acute intracranial abnormality.  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 09/02/2016 MRA Head:  Normal variant diminutive right A1 and duplicated right MCA M1 but no pathologic intracranial vascular abnormality.   02/07/2020 MRI of brain with and without contrast:  stable 11 x 9 mm pineal gland cyst with no suspicious enhancement.   Past NSAIDS:  naproxen 550mg  Past analgesics:  Tylenol Past abortive triptans:  Sumatriptan 50mg  (ineffective) Past abortive ergotamine:  none Past muscle relaxants:  none Past anti-emetic:  none Past antihypertensive medications:  Atenolol 25mg  Past antidepressant medications:  none Past anticonvulsant medications:  none Past anti-CGRP:  none Past vitamins/Herbal/Supplements:  none Past antihistamines/decongestants:  none Other past therapies:  none     Family history of headache:  no  PAST MEDICAL HISTORY: Past Medical History:  Diagnosis Date   Asthma     MEDICATIONS: Current Outpatient Medications on File Prior to Visit  Medication Sig Dispense Refill   albuterol (VENTOLIN HFA) 108 (90 Base) MCG/ACT inhaler Inhale 1-2 puffs into  the lungs every 4 (four) hours as needed for wheezing or shortness of breath. 6 each 3   levothyroxine (SYNTHROID) 50 MCG tablet TAKE 1 TABLET BY MOUTH EVERY DAY 30 tablet 2   rizatriptan  (MAXALT) 10 MG tablet May repeat in 2 hours if needed 10 tablet 11   Vitamin D, Ergocalciferol, (DRISDOL) 1.25 MG (50000 UNIT) CAPS capsule Take 1 capsule (50,000 Units total) by mouth every 7 (seven) days. (Patient not taking: Reported on 07/05/2021) 12 capsule 0   No current facility-administered medications on file prior to visit.    ALLERGIES: No Known Allergies  FAMILY HISTORY: Family History  Problem Relation Age of Onset   Healthy Mother    Healthy Father    Cancer Maternal Grandfather    Thyroid disease Neg Hx       Objective:  *** General: No acute distress.  Patient appears well-groomed.   Head:  Normocephalic/atraumatic Eyes:  Fundi examined but not visualized Neck: supple, no paraspinal tenderness, full range of motion Heart:  Regular rate and rhythm Neurological Exam: alert and oriented to person, place, and time.  Speech fluent and not dysarthric, language intact.  CN II-XII intact. Bulk and tone normal, muscle strength 5/5 throughout.  Sensation to light touch intact.  Deep tendon reflexes 2+ throughout.  Finger to nose testing intact.  Gait normal, Romberg negative.   Metta Clines, DO  CC: Pricilla Holm, MD

## 2022-07-05 ENCOUNTER — Encounter: Payer: Self-pay | Admitting: Neurology

## 2022-07-05 ENCOUNTER — Ambulatory Visit: Payer: Managed Care, Other (non HMO) | Admitting: Neurology

## 2022-07-05 VITALS — BP 139/94 | HR 109 | Ht 65.0 in | Wt 269.4 lb

## 2022-07-05 DIAGNOSIS — G43009 Migraine without aura, not intractable, without status migrainosus: Secondary | ICD-10-CM | POA: Diagnosis not present

## 2022-07-05 DIAGNOSIS — E348 Other specified endocrine disorders: Secondary | ICD-10-CM

## 2022-07-05 MED ORDER — RIZATRIPTAN BENZOATE 10 MG PO TABS: ORAL_TABLET | ORAL | 11 refills | Status: DC

## 2022-07-05 NOTE — Patient Instructions (Signed)
Rizatriptan refilled Follow up with me in one year Plan to repeat MRI in 3 years

## 2023-07-06 NOTE — Progress Notes (Unsigned)
NEUROLOGY FOLLOW UP OFFICE NOTE  Kim Turner [id number]  Assessment/Plan:   Migraine without aura, without status migrainosus, not intractable Pineal gland cyst, stable  Migraine prevention:  deferred as infrequent.  If headaches continue to increase, consider preventative. Migraine rescue:  rizatriptan10mg  Limit use of pain relievers to no more than 2 days out of week to prevent risk of rebound or medication-overuse headache. Keep headache diary Repeat MRI of brain in 2 years Follow up 1 year.    Subjective:  Kim Turner is an 22 year old female with chronic thyroiditis who follows up for migraine.   UPDATE: Reports more headaches than usual over the past 2 months.  She thinks it is stress-related.  Usually occurs at bedtime  Intensity:  usually moderate, rarely severe Duration:  Within an hour with rizatriptan Frequency:  2 to 3 a month Frequency of abortive medication: 1 to 2 days a month Rescue protocol:  Rizatriptan 10mg  Current NSAIDS:  none Current analgesics:  none Current triptans:  rizatriptan 10mg  Current ergotamine:  none Current anti-emetic:  none Current muscle relaxants:  none Current anti-anxiolytic:  none Current sleep aide:  none Current Antihypertensive medications:  none Current Antidepressant medications:  none Current Anticonvulsant medications:  none Current anti-CGRP:  none Current Vitamins/Herbal/Supplements:  none Current Antihistamines/Decongestants:  none Other therapy:  none Hormone/birth control:  Kim Turner Other medications:  none   Caffeine:  2 coffees a month; 1 to 2 cans of Pepsi a week Diet:  Mostly water.  Does not skip meals.  Vegetarian.  1 to 2 cans of soda a week Exercise:  She was swimming daily during the summer.  Trying to find something else to do.   Depression:  no; Anxiety:  A little Other pain: no Sleep hygiene: Good.  Feels tired.   HISTORY:  Onset:  22 years old. Location:  Varies; middle of head, top of  head Quality:  pounding Initial intensity:  5/10.  She denies new headache, thunderclap headache or severe headache that wakes her from sleep. Aura:  no Premonitory Phase:  no Postdrome:  no Associated symptoms:  Photophobia, phonophobia; sometimes nausea.  She denies associated visual disturbance or unilateral numbness or weakness. Initial duration:  Usually wakes up with them.  They last all day Initial drequency:  3 days a week requency of abortive medication: Advil or Tylenol 3 times a month Triggers:  Sometimes loud sounds Relieving factors:  Laying down in dark Activity:  aggravates   Imaging (personally reviewed): 09/01/2016 Carotid US:  Marked heterogeneity of imaged portions of thyroid gland, nonspecific;  Otherwise, normal carotid doppler ultrasound. 09/02/2016 MRI Brain Wo:  No acute intracranial abnormality.  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 09/02/2016 MRA Head:  Normal variant diminutive right A1 and duplicated right MCA M1 but no pathologic intracranial vascular abnormality.   02/07/2020 MRI of brain with and without contrast:  stable 11 x 9 mm pineal gland cyst with no suspicious enhancement.   Past NSAIDS:  naproxen 550mg  Past analgesics:  Tylenol Past abortive triptans:  Sumatriptan 50mg  (ineffective) Past abortive ergotamine:  none Past muscle relaxants:  none Past anti-emetic:  none Past antihypertensive medications:  Atenolol  Past antidepressant medications:  none Past anticonvulsant medications:  none Past anti-CGRP:  none Past vitamins/Herbal/Supplements:  none Past antihistamines/decongestants:  none Other past therapies:  none     Family history of headache:  no  PAST MEDICAL HISTORY: Past Medical History:  Diagnosis Date   Asthma     MEDICATIONS: Current  Outpatient Medications on File Prior to Visit  Medication Sig Dispense Refill   albuterol (VENTOLIN HFA) 108 (90 Base) MCG/ACT inhaler Inhale 1-2 puffs into the lungs every 4 (four) hours as needed for  wheezing or shortness of breath. 6 each 3   levothyroxine (SYNTHROID) 50 MCG tablet TAKE 1 TABLET BY MOUTH EVERY DAY (Patient not taking: Reported on 07/05/2022) 30 tablet 2   rizatriptan (MAXALT) 10 MG tablet May repeat in 2 hours if needed 10 tablet 11   No current facility-administered medications on file prior to visit.    ALLERGIES: No Known Allergies  FAMILY HISTORY: Family History  Problem Relation Age of Onset   Healthy Mother    Healthy Father    Cancer Maternal Grandfather    Thyroid disease Neg Hx       Objective:  Blood pressure (!) 140/80, pulse (!) 119, height 5\' 5"  (1.651 m), weight 262 lb (118.8 kg), SpO2 98%. General: No acute distress.  Patient appears well-groomed.   Head:  Normocephalic/atraumatic Eyes:  Fundi examined but not visualized Neck: supple, no paraspinal tenderness, full range of motion Heart:  Regular rate and rhythm Neurological Exam: alert and oriented.  Speech fluent and not dysarthric, language intact.  CN II-XII intact. Bulk and tone normal, muscle strength 5/5 throughout.  Sensation to light touch intact.  Deep tendon reflexes 2+ throughout.  Finger to nose testing intact.  Gait normal, Romberg negative.   Shon Millet, DO  CC: Hillard Danker, MD

## 2023-07-09 ENCOUNTER — Ambulatory Visit: Payer: BC Managed Care – PPO | Admitting: Neurology

## 2023-07-09 ENCOUNTER — Encounter: Payer: Self-pay | Admitting: Neurology

## 2023-07-09 VITALS — BP 140/80 | HR 119 | Ht 65.0 in | Wt 262.0 lb

## 2023-07-09 DIAGNOSIS — G43009 Migraine without aura, not intractable, without status migrainosus: Secondary | ICD-10-CM | POA: Diagnosis not present

## 2023-07-09 MED ORDER — RIZATRIPTAN BENZOATE 10 MG PO TABS: ORAL_TABLET | ORAL | 11 refills | Status: DC

## 2023-07-12 ENCOUNTER — Telehealth: Payer: Self-pay | Admitting: Internal Medicine

## 2023-07-12 NOTE — Telephone Encounter (Signed)
Pt would like to transfer care to [facility] from [place]. Please advise

## 2023-07-12 NOTE — Telephone Encounter (Signed)
ok with me, thx

## 2023-07-12 NOTE — Telephone Encounter (Signed)
Fine with me

## 2023-09-07 ENCOUNTER — Encounter: Payer: BC Managed Care – PPO | Admitting: Family

## 2023-10-05 ENCOUNTER — Ambulatory Visit: Payer: BC Managed Care – PPO | Admitting: Family

## 2023-10-05 ENCOUNTER — Encounter: Payer: Self-pay | Admitting: Family

## 2023-10-05 VITALS — BP 127/88 | HR 104 | Temp 97.9°F | Ht 65.0 in | Wt 255.4 lb

## 2023-10-05 DIAGNOSIS — F32A Depression, unspecified: Secondary | ICD-10-CM

## 2023-10-05 DIAGNOSIS — F419 Anxiety disorder, unspecified: Secondary | ICD-10-CM | POA: Diagnosis not present

## 2023-10-05 DIAGNOSIS — L910 Hypertrophic scar: Secondary | ICD-10-CM

## 2023-10-05 DIAGNOSIS — E065 Other chronic thyroiditis: Secondary | ICD-10-CM

## 2023-10-05 MED ORDER — FLUOXETINE HCL 10 MG PO CAPS: 10.0000 mg | ORAL_CAPSULE | Freq: Every day | ORAL | 1 refills | Status: DC

## 2023-10-05 NOTE — Assessment & Plan Note (Signed)
New onset, with some situational triggers but also periods of unexplained sadness. No prior therapy or medication. Some associated anxiety. -Refer to therapy for counseling, pt aware to call to schedule first appointment. -Start Prozac 10mg  daily, monitor for side effects and efficacy, pt advised on black box label. -Schedule follow-up in 1 month to assess response to medication and overall mental health status.

## 2023-10-05 NOTE — Patient Instructions (Signed)
Welcome to [facility], It was a pleasure meeting you today!   I will review your lab results via MyChart in a few days. I will sent the Levothyroxine after I review your labs. As discussed, I have sent the generic Prozac to your pharmacy.  I have sent a referral to our therapy team for counseling. But you have to call their office to schedule your first appointment. Start with [facility] at [phone] if they can't schedule you quickly you can try [facility] at [phone].   Please schedule a 1 month follow up visit today.    PLEASE NOTE: If you had any LAB tests please let us know if you have not heard back within a few days. You may see your results on MyChart before we have a chance to review them but we will give you a call once they are reviewed by [place]. If we ordered any REFERRALS today, please let us know if you have not heard from their office within the next week.  Let us know through MyChart if you are needing REFILLS, or have your pharmacy send [place] the request. You can also use MyChart to communicate with me or any office staff.  Please try these tips to maintain a healthy lifestyle: It is important that you exercise regularly at least 30 minutes 5 times a week. Think about what you will eat, plan ahead. Choose whole foods, & think  "clean, green, fresh or frozen" over canned, processed or packaged foods which are more sugary, salty, and fatty. 70 to 75% of food eaten should be fresh vegetables and protein. 2-3  meals daily with healthy snacks between meals, but must be whole fruit, protein or vegetables. Aim to eat over a 10 hour period when you are active, for example, 7am to 5pm, and then STOP after your last meal of the day, drinking only water.  Shorter eating windows, 6-8 hours, are showing benefits in heart disease and blood sugar regulation. Drink water every day! Shoot for 64 ounces daily = 8 cups, no other drink  is as healthy! Fruit juice is best enjoyed in a healthy way, by EATING the fruit.

## 2023-10-05 NOTE — Progress Notes (Signed)
Patient ID: Kim Turner, female    DOB: 02/07/2001, 23 y.o.   MRN: [id number]  Chief Complaint  Patient presents with   Transfer of care   Hypothyroidism    Pt c/o low energy and has not been taking Levothyroxine    Depression    Pt has not been on any medications in the past.        Discussed the use of AI scribe software for clinical note transcription with the patient, who gave verbal consent to proceed.  History of Present Illness   The patient presents with concerns about depression and hypothyroidism management.  The patient experiences depression, which has been present in the past but never addressed. She feels sad sometimes without a clear reason, although currently, it is situational. She has not been on any medications or therapy for depression. Some anxiety is present but not as significant as the depression. No suicidal thoughts today, although she mentions 'maybe, but not really' when asked about it. She feels sad and has low motivation. There is a family history of anxiety, as her sister is diagnosed and on medication for it.  She has a history of hypothyroidism, initially diagnosed in 2021 or 2022, following symptoms of low energy levels. Blood tests and an ultrasound led to the diagnosis and subsequent prescription of medication, which improved her symptoms. She stopped taking the medication approximately a year and a half ago due to not returning for a follow-up appointment, resulting in discontinuation of the medication. No thyroid surgery has been performed, but she did have an ultrasound done.     Assessment & Plan:     Hypothyroidism - History of thyroiditis with previous treatment with levothyroxine , which was stopped approximately 1.5 years ago due to missed follow-up. Patient reported feeling better on the medication. -Order thyroid function tests today to assess current thyroid status. -Based on results, consider restarting levothyroxine. -F/U in 3 mos  or 1 year (based on results)  Depression - New onset, with some situational triggers but also periods of unexplained sadness. No prior therapy or medication. Some associated anxiety. -Refer to therapy for counseling, pt aware to call to schedule first appointment. -Start Prozac 10mg  daily, monitor for side effects and efficacy, pt advised on black box label. -Schedule follow-up in 1 month to assess response to medication and overall mental health status.  Keloid - Right upper ear lobe. History of unsuccessful steroid injections. -Refer to dermatology for further management.     Subjective:    Outpatient Medications Prior to Visit  Medication Sig Dispense Refill   albuterol (VENTOLIN HFA) 108 (90 Base) MCG/ACT inhaler Inhale 1-2 puffs into the lungs every 4 (four) hours as needed for wheezing or shortness of breath. 6 each 3   levothyroxine (SYNTHROID) 50 MCG tablet TAKE 1 TABLET BY MOUTH EVERY DAY 30 tablet 2   rizatriptan (MAXALT) 10 MG tablet May repeat in 2 hours if needed 10 tablet 11   No facility-administered medications prior to visit.   Past Medical History:  Diagnosis Date   Asthma    Past Surgical History:  Procedure Laterality Date   ADENOIDECTOMY     BACK SURGERY     TYMPANOSTOMY TUBE PLACEMENT     No Known Allergies    Objective:    Physical Exam Vitals and nursing note reviewed.  Constitutional:      Appearance: Normal appearance. She is obese.  Cardiovascular:     Rate and Rhythm: Normal rate and regular rhythm.  Pulmonary:     Effort: Pulmonary effort is normal.     Breath sounds: Normal breath sounds.  Musculoskeletal:        General: Normal range of motion.  Skin:    General: Skin is warm and dry.     Findings: Lesion (keloid on upper right pinna, approx 2.5cm in diameter, raised) present.  Neurological:     Mental Status: She is alert.  Psychiatric:        Mood and Affect: Mood normal.        Behavior: Behavior normal.    BP 127/88 (BP  Location: Left Arm, Patient Position: Sitting, Cuff Size: Large)   Pulse (!) 104   Temp 97.9 F (36.6 C) (Temporal)   Ht 5\' 5"  (1.651 m)   Wt 255 lb 6 oz (115.8 kg)   LMP 08/15/2023 (Approximate)   SpO2 99%   BMI 42.50 kg/m  Wt Readings from Last 3 Encounters:  10/05/23 255 lb 6 oz (115.8 kg)  07/10/23 262 lb (118.8 kg)  07/05/22 269 lb 6.4 oz (122.2 kg)       Dulce Sellar, NP

## 2023-10-05 NOTE — Assessment & Plan Note (Signed)
History of thyroiditis with previous treatment with levothyroxine , which was stopped approximately 1.5 years ago due to missed follow-up. Patient reported feeling better on the medication. -Order thyroid function tests today to assess current thyroid status. -Based on results, consider restarting levothyroxine. -F/U in 3 mos or 1 year (based on results)

## 2023-10-06 LAB — TSH: TSH: 5.7 m[IU]/L — ABNORMAL HIGH

## 2023-10-06 LAB — T4, FREE: Free T4: 1.1 ng/dL (ref 0.8–1.8)

## 2023-10-07 ENCOUNTER — Encounter: Payer: Self-pay | Admitting: Family

## 2023-11-07 ENCOUNTER — Encounter: Payer: Self-pay | Admitting: Family

## 2023-11-07 ENCOUNTER — Ambulatory Visit: Payer: BC Managed Care – PPO | Admitting: Family

## 2023-11-07 VITALS — BP 132/82 | HR 98 | Temp 98.1°F | Ht 65.0 in | Wt 241.0 lb

## 2023-11-07 DIAGNOSIS — F419 Anxiety disorder, unspecified: Secondary | ICD-10-CM

## 2023-11-07 DIAGNOSIS — F32A Depression, unspecified: Secondary | ICD-10-CM

## 2023-11-07 MED ORDER — FLUOXETINE HCL 10 MG PO CAPS: 10.0000 mg | ORAL_CAPSULE | Freq: Every day | ORAL | 2 refills | Status: DC

## 2023-11-07 NOTE — Progress Notes (Signed)
   Patient ID: Kim Turner, female    DOB: Sep 18, 2000, 22 y.o.   MRN: [id number]  Chief Complaint  Patient presents with   Depression    Pt states she has been doing good.        Discussed the use of AI scribe software for clinical note transcription with the patient, who gave verbal consent to proceed.  History of Present Illness   Kim Turner is a 23 year old female who presents for follow-up regarding fluoxetine use.  She started taking fluoxetine approximately five to six weeks ago for anxiety and depression and is currently on a 10 mg dose in capsule form, taken in the morning. She reports doing well on the medication, experiencing no side effects such as sx exacerbation, drowsiness, headaches, or nausea, and notes that it has been beneficial. She has completed new patient paperwork for therapy a few days ago and plans to pursue therapy as an additional support.     Assessment & Plan:     Anxiety & Depression - Improvement noted on Fluoxetine 10mg  daily with no reported side effects. Patient has been on medication for approximately 5-6 weeks. -Continue Fluoxetine 10mg  daily, sending refill. -Consider dose increase if needed after full 8 weeks of treatment. -Encouraged patient to follow through with establishing with a therapist. -F/U in 3 months or prn -virtually or in-office.     Subjective:    Outpatient Medications Prior to Visit  Medication Sig Dispense Refill   albuterol (VENTOLIN HFA) 108 (90 Base) MCG/ACT inhaler Inhale 1-2 puffs into the lungs every 4 (four) hours as needed for wheezing or shortness of breath. 6 each 3   FLUoxetine (PROZAC) 10 MG capsule Take 1 capsule (10 mg total) by mouth daily. 30 capsule 1   levothyroxine (SYNTHROID) 50 MCG tablet TAKE 1 TABLET BY MOUTH EVERY DAY 30 tablet 2   rizatriptan (MAXALT) 10 MG tablet May repeat in 2 hours if needed 10 tablet 11   No facility-administered medications prior to visit.   Past Medical History:   Diagnosis Date   Asthma    Past Surgical History:  Procedure Laterality Date   ADENOIDECTOMY     BACK SURGERY     TYMPANOSTOMY TUBE PLACEMENT     No Known Allergies    Objective:    Physical Exam Vitals and nursing note reviewed.  Constitutional:      Appearance: Normal appearance. She is obese.  Cardiovascular:     Rate and Rhythm: Normal rate and regular rhythm.  Pulmonary:     Effort: Pulmonary effort is normal.     Breath sounds: Normal breath sounds.  Musculoskeletal:        General: Normal range of motion.  Skin:    General: Skin is warm and dry.  Neurological:     Mental Status: She is alert.  Psychiatric:        Mood and Affect: Mood normal.        Behavior: Behavior normal.    BP 132/82   Pulse 98   Temp 98.1 F (36.7 C) (Temporal)   Ht 5\' 5"  (1.651 m)   Wt 241 lb (109.3 kg)   SpO2 98%   BMI 40.10 kg/m  Wt Readings from Last 3 Encounters:  11/07/23 241 lb (109.3 kg)  10/05/23 255 lb 6 oz (115.8 kg)  07/10/23 262 lb (118.8 kg)      Dulce Sellar, NP

## 2023-11-07 NOTE — Assessment & Plan Note (Signed)
 Improvement noted on Fluoxetine 10mg  daily with no reported side effects. Patient has been on medication for approximately 5-6 weeks. -Continue Fluoxetine 10mg  daily, sending refill. -Consider dose increase if needed after full 8 weeks of treatment. -Encouraged patient to follow through with establishing with a therapist. -F/U in 3 months or prn -virtually or in-office.

## 2024-02-07 ENCOUNTER — Ambulatory Visit: Admitting: Family

## 2024-02-15 ENCOUNTER — Ambulatory Visit: Admitting: Family

## 2024-02-29 ENCOUNTER — Ambulatory Visit: Admitting: Family

## 2024-02-29 ENCOUNTER — Encounter: Payer: Self-pay | Admitting: Family

## 2024-02-29 VITALS — BP 124/82 | HR 99 | Temp 97.3°F | Ht 65.0 in | Wt 227.8 lb

## 2024-02-29 DIAGNOSIS — E038 Other specified hypothyroidism: Secondary | ICD-10-CM | POA: Insufficient documentation

## 2024-02-29 DIAGNOSIS — F32A Depression, unspecified: Secondary | ICD-10-CM | POA: Diagnosis not present

## 2024-02-29 DIAGNOSIS — E669 Obesity, unspecified: Secondary | ICD-10-CM | POA: Diagnosis not present

## 2024-02-29 DIAGNOSIS — F419 Anxiety disorder, unspecified: Secondary | ICD-10-CM

## 2024-02-29 MED ORDER — FLUOXETINE HCL 10 MG PO CAPS: 10.0000 mg | ORAL_CAPSULE | Freq: Every day | ORAL | 2 refills | Status: AC

## 2024-02-29 NOTE — Assessment & Plan Note (Addendum)
 Symptoms include fatigue and insomnia. Therapy options discussed for symptom management, info provided last visit, but not pursued yet. Taking Prozac  10mg  every morning. Denies worsening sx.  - Provide referral for therapy and encourage contact with a therapist for counseling. - Sending refill for Prozac , consider taking at night if feeling more fatigue - F/U in 2 months

## 2024-02-29 NOTE — Assessment & Plan Note (Signed)
 Previous tests in Jan showed normal hormone level with slightly elevated TSH. Complains of increased fatigue, but able to lose weight on her own since last visit. - Order thyroid  function tests to reassess subclinical thyroid  dysfunction.

## 2024-02-29 NOTE — Progress Notes (Signed)
 Patient ID: Kim Turner, female    DOB: 10/19/2000, 23 y.o.   MRN: [id number]  Chief Complaint  Patient presents with   Anxiety    Pt in office for 3 mon f/u for anxiety and depression; pt states she is doing well other than being more tired than usual; even sleeping more still makes her feel tired; been having the tiredness for the past month  Discussed the use of AI scribe software for clinical note transcription with the patient, who gave verbal consent to proceed.  History of Present Illness Kim Turner is a 23 year old female with depression who presents with persistent fatigue and sleep disturbances and depression follow up.  She experiences increased tiredness for about a month, with no initial issues when starting her current medication. She takes her medication in the morning and questions if the tiredness is a side effect or related to her thyroid  condition. Her thyroid  levels were checked in January, showing normal hormone levels but slightly off TSH levels. She sleeps approximately twelve hours some nights, but still feels tired upon waking. Daytime naps disrupt her sleep cycle. She has difficulty falling asleep but maintains good sleep quality once asleep. She has eliminated caffeine, drinking only water and has lost 14lbs. She has ongoing depression and has not seen a counselor recently, despite a referral in January. She lacks energy to follow up. She has lost fourteen pounds since her last visit, due to reduced portion sizes and cutting out sodas and desserts. She is not exercising, is unemployed, and lives at home with her parents. She sometimes plays video games.  Assessment & Plan Depression Symptoms include fatigue and insomnia. Therapy options discussed for symptom management, info provided last visit, but not pursued yet. Taking Prozac  10mg  every morning. Denies worsening sx.  - Provide referral for therapy and encourage contact with a therapist for counseling. -  Sending refill for Prozac , consider taking at night if feeling more fatigue. - F/U in 2 months  Fatigue Increased fatigue possibly related to depression or subclinical thyroid  dysfunction. Excessive sleep may contribute. - Switch medication dosing to nighttime to assess impact on fatigue. - Order thyroid  function tests to reassess subclinical thyroid  dysfunction. - Encourage establishment of a regular sleep routine and avoidance of daytime naps and late night video games, TV or phone use right before trying to sleep. - Provided educational material on healthy sleep habits. - Encourage exercise to improve energy levels.  Subclinical HypoThyroid Dysfunction Previous tests in Jan showed normal hormone level with slightly elevated TSH. - Order thyroid  function tests to reassess subclinical thyroid  dysfunction.  Obesity - Weight down 14lbs, offered praise and encouragement to continue. Beneficial lifestyle changes include and dietary modifications. - Encourage continued dietary modifications and weight loss. - Incorporate exercise with cardio as many days as able, discussed trying different fun sports/activities to enjoy. - Will continue to follow.   Subjective:    Outpatient Medications Prior to Visit  Medication Sig Dispense Refill   acetaminophen  (TYLENOL ) 500 MG tablet Take 500 mg by mouth.     albuterol  (VENTOLIN  HFA) 108 (90 Base) MCG/ACT inhaler Inhale 1-2 puffs into the lungs every 4 (four) hours as needed for wheezing or shortness of breath. 6 each 3   FLUoxetine  (PROZAC ) 10 MG capsule Take 1 capsule (10 mg total) by mouth daily. 30 capsule 2   rizatriptan  (MAXALT ) 10 MG tablet May repeat in 2 hours if needed 10 tablet 11   levothyroxine  (SYNTHROID ) 50 MCG  tablet TAKE 1 TABLET BY MOUTH EVERY DAY (Patient not taking: Reported on 02/29/2024) 30 tablet 2   No facility-administered medications prior to visit.   Past Medical History:  Diagnosis Date   Asthma    Past Surgical  History:  Procedure Laterality Date   ADENOIDECTOMY     BACK SURGERY     TYMPANOSTOMY TUBE PLACEMENT     No Known Allergies    Objective:    Physical Exam Vitals and nursing note reviewed.  Constitutional:      Appearance: Normal appearance. She is obese.   Cardiovascular:     Rate and Rhythm: Normal rate and regular rhythm.  Pulmonary:     Effort: Pulmonary effort is normal.     Breath sounds: Normal breath sounds.   Musculoskeletal:        General: Normal range of motion.   Skin:    General: Skin is warm and dry.   Neurological:     Mental Status: She is alert.   Psychiatric:        Mood and Affect: Mood normal.        Behavior: Behavior normal.    BP 124/82 (BP Location: Left Arm, Patient Position: Sitting, Cuff Size: Normal)   Pulse 99   Temp (!) 97.3 F (36.3 C) (Temporal)   Ht 5' 5 (1.651 m)   Wt 227 lb 12.8 oz (103.3 kg)   SpO2 100%   BMI 37.91 kg/m  Wt Readings from Last 3 Encounters:  02/29/24 227 lb 12.8 oz (103.3 kg)  11/07/23 241 lb (109.3 kg)  10/05/23 255 lb 6 oz (115.8 kg)      Lucius Krabbe, NP

## 2024-02-29 NOTE — Assessment & Plan Note (Signed)
 Weight down 14lbs, offered praise and encouragement to continue with her strategies. Beneficial lifestyle changes include dietary modifications. - Encourage continued dietary modifications and weight loss. - Incorporate exercise with cardio as many days as able, discussed trying different fun sports/activities to enjoy. - Will continue to follow.

## 2024-02-29 NOTE — Patient Instructions (Signed)
 It was very nice to see you today!   I have sent a referral to our therapy team for counseling. But you have to call their office to schedule your first appointment. Start with [facility] at [phone] if they can't schedule you quickly you can try [facility] at [phone].         PLEASE NOTE:  If you had any lab tests please let us  know if you have not heard back within a few days. You may see your results on MyChart before we have a chance to review them but we will give you a call once they are reviewed by us . If we ordered any referrals today, please let us  know if you have not heard from their office within the next week.

## 2024-03-03 ENCOUNTER — Ambulatory Visit: Payer: Self-pay | Admitting: Family

## 2024-03-03 DIAGNOSIS — E038 Other specified hypothyroidism: Secondary | ICD-10-CM

## 2024-03-04 LAB — THYROID PEROXIDASE ANTIBODY: Thyroperoxidase Ab SerPl-aCnc: 392 [IU]/mL — ABNORMAL HIGH (ref <9)

## 2024-03-04 LAB — TEST AUTHORIZATION

## 2024-03-04 LAB — TSH: TSH: 4.62 m[IU]/L — ABNORMAL HIGH

## 2024-03-04 LAB — T4, FREE: Free T4: 1.2 ng/dL (ref 0.8–1.8)

## 2024-03-09 MED ORDER — LEVOTHYROXINE SODIUM 25 MCG PO TABS: 25.0000 ug | ORAL_TABLET | Freq: Every day | ORAL | 2 refills | Status: AC

## 2024-04-30 ENCOUNTER — Ambulatory Visit: Admitting: Family

## 2024-05-02 ENCOUNTER — Ambulatory Visit: Admitting: Family

## 2024-07-07 NOTE — Progress Notes (Unsigned)
 NEUROLOGY FOLLOW UP OFFICE NOTE  [name] [id number]  Assessment/Plan:   Migraine without aura, without status migrainosus, not intractable Pineal gland cyst, stable  Migraine prevention:  deferred as infrequent.  If headaches continue to increase, consider preventative. Migraine rescue:  rizatriptan  10mg  Limit use of pain relievers to no more than 2 days out of week to prevent risk of rebound or medication-overuse headache. Keep headache diary Repeat MRI of brain in 1 year Follow up 1 year.    Subjective:  Kim Turner is an 23 year old female with chronic thyroiditis who follows up for migraine.   UPDATE: She is doing well. Intensity:  usually moderate, rarely severe Duration:  30 minutes with rizatriptan  Frequency:  no more than 2 to 3 a month Frequency of abortive medication: 1 to 2 days a month Rescue protocol:  Rizatriptan  10mg  Current NSAIDS:  none Current analgesics:  none Current triptans:  rizatriptan  10mg  Current ergotamine:  none Current anti-emetic:  none Current muscle relaxants:  none Current anti-anxiolytic:  none Current sleep aide:  none Current Antihypertensive medications:  none Current Antidepressant medications:  none Current Anticonvulsant medications:  none Current anti-CGRP:  none Current Vitamins/Herbal/Supplements:  none Current Antihistamines/Decongestants:  none Other therapy:  none Hormone/birth control:  Kim Turner Other medications:  none   Caffeine:  2 coffees a month; 1 to 2 cans of Pepsi a week Diet:  Mostly water.  Does not skip meals.  Vegetarian.  1 to 2 cans of soda a week Exercise:  She was swimming daily during the summer.  Trying to find something else to do.   Depression:  no; Anxiety:  A little Other pain: no Sleep hygiene: Good.  Feels tired.   HISTORY:  Onset:  23 years old. Location:  Varies; middle of head, top of head Quality:  pounding Initial intensity:  5/10.  She denies new headache, thunderclap  headache or severe headache that wakes her from sleep. Aura:  no Premonitory Phase:  no Postdrome:  no Associated symptoms:  Photophobia, phonophobia; sometimes nausea.  She denies associated visual disturbance or unilateral numbness or weakness. Initial duration:  Usually wakes up with them.  They last all day Initial drequency:  3 days a week requency of abortive medication: Advil or Tylenol  3 times a month Triggers:  Sometimes loud sounds Relieving factors:  Laying down in dark Activity:  aggravates   Imaging (personally reviewed): 09/01/2016 Carotid US :  Marked heterogeneity of imaged portions of thyroid  gland, nonspecific;  Otherwise, normal carotid doppler ultrasound. 09/02/2016 MRI Brain Wo:  No acute intracranial abnormality.  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 09/02/2016 MRA Head:  Normal variant diminutive right A1 and duplicated right MCA M1 but no pathologic intracranial vascular abnormality.   02/07/2020 MRI of brain with and without contrast:  stable 11 x 9 mm pineal gland cyst with no suspicious enhancement.   Past NSAIDS:  naproxen  550mg  Past analgesics:  Tylenol  Past abortive triptans:  Sumatriptan  50mg  (ineffective) Past abortive ergotamine:  none Past muscle relaxants:  none Past anti-emetic:  none Past antihypertensive medications:  Atenolol  Past antidepressant medications:  none Past anticonvulsant medications:  none Past anti-CGRP:  none Past vitamins/Herbal/Supplements:  none Past antihistamines/decongestants:  none Other past therapies:  none     Family history of headache:  no  PAST MEDICAL HISTORY: Past Medical History:  Diagnosis Date   Asthma     MEDICATIONS: Current Outpatient Medications on File Prior to Visit  Medication Sig Dispense Refill   acetaminophen  (  TYLENOL ) 500 MG tablet Take 500 mg by mouth.     albuterol  (VENTOLIN  HFA) 108 (90 Base) MCG/ACT inhaler Inhale 1-2 puffs into the lungs every 4 (four) hours as needed for wheezing or shortness of  breath. 6 each 3   FLUoxetine  (PROZAC ) 10 MG capsule Take 1 capsule (10 mg total) by mouth daily. 30 capsule 2   levothyroxine  (SYNTHROID ) 25 MCG tablet Take 1 tablet (25 mcg total) by mouth daily. 30 tablet 2   rizatriptan  (MAXALT ) 10 MG tablet May repeat in 2 hours if needed 10 tablet 11   No current facility-administered medications on file prior to visit.    ALLERGIES: No Known Allergies  FAMILY HISTORY: Family History  Problem Relation Age of Onset   Diabetes Mother    Healthy Mother    Hypertension Father    Diabetes Father    Healthy Father    Cancer Maternal Grandfather    Thyroid  disease Neg Hx       Objective:  Blood pressure 135/84, pulse (!) 112, height 5' 5 (1.651 m), weight 246 lb (111.6 kg), SpO2 98%. General: No acute distress.  Patient appears well-groomed.   Head:  Normocephalic/atraumatic Eyes:  Fundi examined but not visualized Neck: supple, no paraspinal tenderness, full range of motion Heart:  Regular rate and rhythm Neurological Exam: alert and oriented.  Speech fluent and not dysarthric, language intact.  CN II-XII intact. Bulk and tone normal, muscle strength 5/5 throughout.  Sensation to light touch intact.  Deep tendon reflexes 2+ throughout.  Finger to nose testing intact.  Gait normal, Romberg negative.   Kim Dunnings, DO  CC: Kim Comment, NP

## 2024-07-08 ENCOUNTER — Ambulatory Visit: Payer: Self-pay | Admitting: Neurology

## 2024-07-08 VITALS — BP 135/84 | HR 112 | Ht 65.0 in | Wt 246.0 lb

## 2024-07-08 DIAGNOSIS — G43009 Migraine without aura, not intractable, without status migrainosus: Secondary | ICD-10-CM

## 2024-07-08 DIAGNOSIS — E348 Other specified endocrine disorders: Secondary | ICD-10-CM

## 2024-07-08 MED ORDER — RIZATRIPTAN BENZOATE 10 MG PO TABS: 10.0000 mg | ORAL_TABLET | ORAL | 11 refills | Status: AC | PRN

## 2024-07-09 ENCOUNTER — Encounter: Payer: Self-pay | Admitting: Neurology

## 2024-07-09 NOTE — Addendum Note (Signed)
 Addended by: OZELL JESUSA PARAS on: 07/09/2024 09:58 AM   Modules accepted: Orders

## 2024-08-21 ENCOUNTER — Encounter: Payer: Self-pay | Admitting: Neurology

## 2024-08-22 ENCOUNTER — Telehealth: Payer: Self-pay

## 2024-08-22 DIAGNOSIS — E348 Other specified endocrine disorders: Secondary | ICD-10-CM

## 2024-08-22 NOTE — Telephone Encounter (Signed)
 [facility] MRI was due in one year from her visit 07/08/24.   Old order cancalled and appt new order added.

## 2024-08-29 ENCOUNTER — Other Ambulatory Visit

## 2025-07-08 ENCOUNTER — Ambulatory Visit: Admitting: Neurology
